# Patient Record
Sex: Female | Born: 1940 | Race: White | Hispanic: No | Smoking: Former smoker
Health system: Southern US, Community
[De-identification: ages and names within clinical notes are randomized; demographics above are authoritative.]

## PROBLEM LIST (undated history)

## (undated) HISTORY — PX: LUNG LOBECTOMY: SHX167

## (undated) HISTORY — PX: KNEE SURGERY: SHX244

---

## 2017-02-23 ENCOUNTER — Emergency Department (HOSPITAL_BASED_OUTPATIENT_CLINIC_OR_DEPARTMENT_OTHER)
Admission: EM | Admit: 2017-02-23 | Discharge: 2017-02-23 | Disposition: A | Discharge status: Home / Self Care | Payer: Medicare Other | Attending: Emergency Medicine | Admitting: Emergency Medicine

## 2017-02-23 ENCOUNTER — Emergency Department (HOSPITAL_BASED_OUTPATIENT_CLINIC_OR_DEPARTMENT_OTHER): Payer: Medicare Other

## 2017-02-23 ENCOUNTER — Encounter (HOSPITAL_BASED_OUTPATIENT_CLINIC_OR_DEPARTMENT_OTHER): Payer: Self-pay | Admitting: *Deleted

## 2017-02-23 ENCOUNTER — Other Ambulatory Visit: Payer: Self-pay

## 2017-02-23 DIAGNOSIS — M25562 Pain in left knee: Secondary | ICD-10-CM

## 2017-02-23 DIAGNOSIS — Z7982 Long term (current) use of aspirin: Secondary | ICD-10-CM | POA: Diagnosis not present

## 2017-02-23 DIAGNOSIS — Z87891 Personal history of nicotine dependence: Secondary | ICD-10-CM | POA: Insufficient documentation

## 2017-02-23 DIAGNOSIS — Z79899 Other long term (current) drug therapy: Secondary | ICD-10-CM | POA: Diagnosis not present

## 2017-02-23 DIAGNOSIS — M79605 Pain in left leg: Secondary | ICD-10-CM | POA: Diagnosis present

## 2017-02-23 DIAGNOSIS — Z794 Long term (current) use of insulin: Secondary | ICD-10-CM | POA: Diagnosis not present

## 2017-02-23 LAB — CBC WITH DIFFERENTIAL/PLATELET
BASOS ABS: 0 10*3/uL (ref 0.0–0.1)
Basophils Relative: 0 %
Eosinophils Absolute: 0.1 10*3/uL (ref 0.0–0.7)
Eosinophils Relative: 1 %
HCT: 30.1 % — ABNORMAL LOW (ref 36.0–46.0)
Hemoglobin: 9.2 g/dL — ABNORMAL LOW (ref 12.0–15.0)
LYMPHS PCT: 10 %
Lymphs Abs: 1.3 10*3/uL (ref 0.7–4.0)
MCH: 29.9 pg (ref 26.0–34.0)
MCHC: 30.6 g/dL (ref 30.0–36.0)
MCV: 97.7 fL (ref 78.0–100.0)
MONO ABS: 1 10*3/uL (ref 0.1–1.0)
Monocytes Relative: 8 %
Neutro Abs: 10.7 10*3/uL — ABNORMAL HIGH (ref 1.7–7.7)
Neutrophils Relative %: 81 %
PLATELETS: 193 10*3/uL (ref 150–400)
RBC: 3.08 MIL/uL — ABNORMAL LOW (ref 3.87–5.11)
RDW: 17.3 % — ABNORMAL HIGH (ref 11.5–15.5)
WBC: 13.1 10*3/uL — ABNORMAL HIGH (ref 4.0–10.5)

## 2017-02-23 MED ORDER — OXYCODONE-ACETAMINOPHEN 5-325 MG PO TABS
1.0000 | ORAL_TABLET | Freq: Once | ORAL | Status: AC
  Administered 2017-02-23: 1 via ORAL
  Filled 2017-02-23: qty 1

## 2017-02-23 MED ORDER — OXYCODONE-ACETAMINOPHEN 5-325 MG PO TABS: 1.0000 | ORAL_TABLET | Freq: Four times a day (QID) | ORAL | 0 refills | Status: AC | PRN

## 2017-02-23 NOTE — ED Provider Notes (Signed)
[facility] EMERGENCY DEPARTMENT Provider Note   CSN: [id number] Arrival date & time: 02/23/17  1421     History   Chief Complaint Chief Complaint  Patient presents with  . Leg Pain    HPI Karen Contreras is a 77 y.o. female.  HPI Patient presents with left calf and knee pain.  Began in the calf around 2 days ago now more in her left knee.  States she cannot walk because the pain.  No trauma.  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  No fevers or chills.  No cough.  Has chronic oxygen but states her breathing is not really any different than her baseline.  Has some chronic swelling of both her lower legs and states it is normally worse in the left side. No past medical history on file.  There are no active problems to display for this patient.   Past Surgical History:  Procedure Laterality Date  . KNEE SURGERY    . LUNG LOBECTOMY      OB History    No data available       Home Medications    Prior to Admission medications   Medication Sig Start Date End Date Taking? Authorizing Provider  acetaminophen (TYLENOL) 325 MG tablet Take 325 mg by mouth every 8 (eight) hours as needed.   Yes [provider]  albuterol (PROVENTIL) (2.5 MG/3ML) 0.083% nebulizer solution Take 2.5 mg by nebulization every 6 (six) hours as needed for wheezing or shortness of breath.   Yes [provider]  amLODipine (NORVASC) 5 MG tablet Take 2.5 mg by mouth daily.   Yes [provider]  aspirin 325 MG EC tablet Take 325 mg by mouth daily.   Yes [provider]  aspirin 81 MG chewable tablet Chew 81 mg by mouth daily.   Yes [provider]  calcium carbonate (OSCAL) 1500 (600 Ca) MG TABS tablet Take by mouth daily with breakfast.   Yes [provider]  cetirizine (ZYRTEC) 10 MG tablet Take 10 mg by mouth daily.   Yes [provider]  citalopram (CELEXA) 40 MG tablet Take 40 mg by mouth 2 (two) times daily.   Yes [provider]  ferrous sulfate 325 (65 FE) MG tablet Take 325 mg by mouth 2 (two) times daily with a meal.   Yes [provider]  glucosamine-chondroitin 500-400 MG tablet Take 1 tablet by mouth 3 (three) times daily.   Yes [provider]  insulin glargine (LANTUS) 100 UNIT/ML injection Inject 5 Units into the skin at bedtime.   Yes [provider]  insulin lispro (HUMALOG) 100 UNIT/ML injection Inject into the skin 3 (three) times daily before meals.   Yes [provider]  levothyroxine (SYNTHROID, LEVOTHROID) 50 MCG tablet Take 50 mcg by mouth daily before breakfast.   Yes [provider]  omeprazole (PRILOSEC) 20 MG capsule Take 20 mg by mouth daily.   Yes [provider]  ondansetron (ZOFRAN) 4 MG tablet Take 4 mg by mouth every 6 (six) hours as needed for nausea or vomiting.   Yes [provider]  OXYGEN Inhale 3 L/min into the lungs continuous.   Yes [provider]  pioglitazone (ACTOS) 45 MG tablet Take 45 mg by mouth daily.   Yes [provider]  polyethylene glycol (MIRALAX / GLYCOLAX) packet Take 17 g by mouth daily.   Yes [provider]  oxyCODONE-acetaminophen (PERCOCET/ROXICET) 5-325 MG tablet Take 1 tablet by mouth every  6 (six) hours as needed for severe pain. 02/23/17   Benjiman Core, MD    Family History No family history on file.  Social History Social History   Tobacco Use  . Smoking status: Former Games developer  . Smokeless tobacco: Never Used  Substance Use Topics  . Alcohol use: No    Frequency: Never  . Drug use: No     Allergies   Crestor [rosuvastatin] and Morphine and related   Review of Systems Review of Systems  Constitutional: Negative for chills and fever.  HENT: Negative for congestion.   Respiratory: Positive for shortness of breath.   Cardiovascular: Positive for leg swelling.  Gastrointestinal: Negative for abdominal pain.  Genitourinary: Negative for  flank pain.  Musculoskeletal:       Left calf left knee pain.  Neurological: Negative for seizures.  Hematological: Negative for adenopathy.  Psychiatric/Behavioral: Negative for confusion.     Physical Exam Updated Vital Signs BP (!) 105/93 (BP Location: Right Arm)   Pulse 96   Temp 98.7 F (37.1 C) (Oral)   Resp (!) 24   Ht 5\' 8"  (1.727 m)   Wt 135.6 kg (299 lb)   SpO2 97%   BMI 45.46 kg/m   Physical Exam  Constitutional: She appears well-developed.  Patient is obese  HENT:  Head: Normocephalic.  Cardiovascular: Normal rate.  Pulmonary/Chest: Effort normal.  Patient is on nasal cannula oxygen  Abdominal: There is no tenderness.  Musculoskeletal: She exhibits edema.  Some tenderness and decreased range of motion of left knee.  No erythema.  No effusion.  Tenderness over left calf.  There is edema bilateral lower extremities, worse on the left side.  Some delayed capillary refill also.  Strong dorsalis pedis pulse on the left side.  Neurological: She is alert.  Skin: Skin is warm. Capillary refill takes less than 2 seconds.  Psychiatric: She has a normal mood and affect.     ED Treatments / Results  Labs (all labs ordered are listed, but only abnormal results are displayed) Labs Reviewed  CBC WITH DIFFERENTIAL/PLATELET - Abnormal; Notable for the following components:      Result Value   WBC 13.1 (*)    RBC 3.08 (*)    Hemoglobin 9.2 (*)    HCT 30.1 (*)    RDW 17.3 (*)    Neutro Abs 10.7 (*)    All other components within normal limits    EKG  EKG Interpretation None       Radiology US Venous Img Lower Unilateral Left  Result Date: 02/23/2017 CLINICAL DATA:  77 year old female with left calf pain and swelling for the past 2 days EXAM: LEFT LOWER EXTREMITY VENOUS DOPPLER ULTRASOUND TECHNIQUE: Gray-scale sonography with graded compression, as well as color Doppler and duplex ultrasound were performed to evaluate the lower extremity deep venous systems from  the level of the common femoral vein and including the common femoral, femoral, profunda femoral, popliteal and calf veins including the posterior tibial, peroneal and gastrocnemius veins when visible. The superficial great saphenous vein was also interrogated. Spectral Doppler was utilized to evaluate flow at rest and with distal augmentation maneuvers in the common femoral, femoral and popliteal veins. COMPARISON:  None. FINDINGS: Contralateral Common Femoral Vein: Respiratory phasicity is normal and symmetric with the symptomatic side. No evidence of thrombus. Normal compressibility. Common Femoral Vein: No evidence of thrombus. Normal compressibility, respiratory phasicity and response to augmentation. Saphenofemoral Junction: No evidence of thrombus. Normal compressibility and flow on color Doppler imaging.  Profunda Femoral Vein: No evidence of thrombus. Normal compressibility and flow on color Doppler imaging. Femoral Vein: No evidence of thrombus. Normal compressibility, respiratory phasicity and response to augmentation. Popliteal Vein: No evidence of thrombus. Normal compressibility, respiratory phasicity and response to augmentation. Calf Veins: No evidence of thrombus. Normal compressibility and flow on color Doppler imaging. Superficial Great Saphenous Vein: No evidence of thrombus. Normal compressibility. Venous Reflux:  None. Other Findings:  None. IMPRESSION: No evidence of deep venous thrombosis. Electronically Signed   By: Malachy MoanHeath  McCullough M.D.   On: 02/23/2017 16:57   Dg Knee Complete 4 Views Left  Result Date: 02/23/2017 CLINICAL DATA:  Left knee pain beginning last night with weight-bearing and movement. EXAM: LEFT KNEE - COMPLETE 4+ VIEW COMPARISON:  None. FINDINGS: Previous total knee replacement. Components grossly well positioned. Small amount of knee joint fluid. No acute or unexpected finding. IMPRESSION: No acute or unexpected finding. Previous total knee replacement. Small knee joint  effusion. Electronically Signed   By: Paulina FusiMark  Shogry M.D.   On: 02/23/2017 15:43    Procedures Procedures (including critical care time)  Medications Ordered in ED Medications  oxyCODONE-acetaminophen (PERCOCET/ROXICET) 5-325 MG per tablet 1 tablet (1 tablet Oral Given 02/23/17 1606)     Initial Impression / Assessment and Plan / ED Course  I have reviewed the triage vital signs and the nursing notes.  Pertinent labs & imaging results that were available during my care of the patient were reviewed by me and considered in my medical decision making (see chart for details).     Patient with left knee pain.  Negative Doppler.  X-ray reassuring.  No erythema of the knee.  No fevers.  White count is mildly elevated.  Feels better after Percocet.  Has follow-up appointment with her orthopedic surgeon.  Will discharge home.  Final Clinical Impressions(s) / ED Diagnoses   Final diagnoses:  Acute pain of left knee    ED Discharge Orders        Ordered    oxyCODONE-acetaminophen (PERCOCET/ROXICET) 5-325 MG tablet  Every 6 hours PRN     02/23/17 1806       Benjiman CorePickering, Kwesi Sangha, MD 02/23/17 1830

## 2017-02-23 NOTE — ED Triage Notes (Signed)
Left calf pain x 2 days. Pain has gotten worse and is now at knee level.

## 2017-02-23 NOTE — Discharge Instructions (Signed)
Follow-up with your orthopedic surgeon as planned.  You can take the oxycodone acetaminophen 5/325 1 pill every 6 hours as needed. You may administer it yourself.

## 2018-04-18 ENCOUNTER — Inpatient Hospital Stay
Admission: AD | Admit: 2018-04-18 | Discharge: 2018-05-07 | Disposition: A | Discharge status: Home Health | Payer: Self-pay | Source: Other Acute Inpatient Hospital | Attending: Internal Medicine | Admitting: Internal Medicine

## 2018-04-18 ENCOUNTER — Other Ambulatory Visit (HOSPITAL_COMMUNITY): Payer: Self-pay

## 2018-04-18 DIAGNOSIS — I509 Heart failure, unspecified: Secondary | ICD-10-CM

## 2018-04-18 MED ORDER — GLUCAGON HCL RDNA (DIAGNOSTIC) 1 MG IJ SOLR
1.00 | INTRAMUSCULAR | Status: DC
Start: ? — End: 2018-04-18

## 2018-04-18 MED ORDER — PANTOPRAZOLE SODIUM 40 MG PO TBEC
40.00 | DELAYED_RELEASE_TABLET | ORAL | Status: DC
Start: 2018-04-19 — End: 2018-04-18

## 2018-04-18 MED ORDER — FOLIC ACID 1 MG PO TABS
1.00 | ORAL_TABLET | ORAL | Status: DC
Start: 2018-04-19 — End: 2018-04-18

## 2018-04-18 MED ORDER — ASPIRIN EC 81 MG PO TBEC
81.00 | DELAYED_RELEASE_TABLET | ORAL | Status: DC
Start: 2018-04-19 — End: 2018-04-18

## 2018-04-18 MED ORDER — GENERIC EXTERNAL MEDICATION
80.00 | Status: DC
Start: 2018-04-18 — End: 2018-04-18

## 2018-04-18 MED ORDER — MAGNESIUM OXIDE 400 MG PO TABS
400.00 | ORAL_TABLET | ORAL | Status: DC
Start: 2018-04-18 — End: 2018-04-18

## 2018-04-18 MED ORDER — GLUCOSE 40 % PO GEL
15.00 | ORAL | Status: DC
Start: ? — End: 2018-04-18

## 2018-04-18 MED ORDER — DEXTROSE 10 % IV SOLN
125.00 | INTRAVENOUS | Status: DC
Start: ? — End: 2018-04-18

## 2018-04-18 MED ORDER — FERROUS SULFATE 325 (65 FE) MG PO TABS
325.00 | ORAL_TABLET | ORAL | Status: DC
Start: 2018-04-18 — End: 2018-04-18

## 2018-04-18 MED ORDER — CETIRIZINE HCL 10 MG PO TABS
10.00 | ORAL_TABLET | ORAL | Status: DC
Start: 2018-04-19 — End: 2018-04-18

## 2018-04-18 MED ORDER — EZETIMIBE 10 MG PO TABS
10.00 | ORAL_TABLET | ORAL | Status: DC
Start: 2018-04-19 — End: 2018-04-18

## 2018-04-18 MED ORDER — ALPRAZOLAM 0.5 MG PO TABS
.50 | ORAL_TABLET | ORAL | Status: DC
Start: ? — End: 2018-04-18

## 2018-04-18 MED ORDER — INSULIN LISPRO 100 UNIT/ML ~~LOC~~ SOLN
1.00 | SUBCUTANEOUS | Status: DC
Start: 2018-04-18 — End: 2018-04-18

## 2018-04-18 MED ORDER — LEVOTHYROXINE SODIUM 50 MCG PO TABS
50.00 | ORAL_TABLET | ORAL | Status: DC
Start: 2018-04-19 — End: 2018-04-18

## 2018-04-18 MED ORDER — ACETAMINOPHEN 325 MG PO TABS
650.00 | ORAL_TABLET | ORAL | Status: DC
Start: ? — End: 2018-04-18

## 2018-04-18 MED ORDER — FLUTICASONE FUROATE-VILANTEROL 100-25 MCG/INH IN AEPB
1.00 | INHALATION_SPRAY | RESPIRATORY_TRACT | Status: DC
Start: 2018-04-19 — End: 2018-04-18

## 2018-04-18 MED ORDER — INSULIN GLARGINE 100 UNIT/ML ~~LOC~~ SOLN
10.00 | SUBCUTANEOUS | Status: DC
Start: 2018-04-18 — End: 2018-04-18

## 2018-04-18 MED ORDER — PREDNISONE 20 MG PO TABS
40.00 | ORAL_TABLET | ORAL | Status: DC
Start: 2018-04-19 — End: 2018-04-18

## 2018-04-18 MED ORDER — POTASSIUM CHLORIDE CRYS ER 20 MEQ PO TBCR
20.00 | EXTENDED_RELEASE_TABLET | ORAL | Status: DC
Start: 2018-04-19 — End: 2018-04-18

## 2018-04-18 MED ORDER — DOCUSATE SODIUM 100 MG PO CAPS
100.00 | ORAL_CAPSULE | ORAL | Status: DC
Start: ? — End: 2018-04-18

## 2018-04-18 MED ORDER — SODIUM CHLORIDE FLUSH 0.9 % IV SOLN
5.00 | INTRAVENOUS | Status: DC
Start: 2018-04-18 — End: 2018-04-18

## 2018-04-18 MED ORDER — POLYETHYLENE GLYCOL 3350 17 G PO PACK
17.00 | PACK | ORAL | Status: DC
Start: ? — End: 2018-04-18

## 2018-04-18 MED ORDER — ALBUTEROL SULFATE (2.5 MG/3ML) 0.083% IN NEBU
2.50 | INHALATION_SOLUTION | RESPIRATORY_TRACT | Status: DC
Start: ? — End: 2018-04-18

## 2018-04-18 MED ORDER — AMLODIPINE BESYLATE 2.5 MG PO TABS
2.50 | ORAL_TABLET | ORAL | Status: DC
Start: 2018-04-19 — End: 2018-04-18

## 2018-04-18 MED ORDER — ONDANSETRON HCL 4 MG/2ML IJ SOLN
4.00 | INTRAMUSCULAR | Status: DC
Start: ? — End: 2018-04-18

## 2018-04-18 MED ORDER — HEPARIN SODIUM (PORCINE) 5000 UNIT/ML IJ SOLN
5000.00 | INTRAMUSCULAR | Status: DC
Start: 2018-04-18 — End: 2018-04-18

## 2018-04-18 MED ORDER — IPRATROPIUM BROMIDE 0.02 % IN SOLN
500.00 | RESPIRATORY_TRACT | Status: DC
Start: ? — End: 2018-04-18

## 2018-04-18 MED ORDER — CITALOPRAM HYDROBROMIDE 20 MG PO TABS
40.00 | ORAL_TABLET | ORAL | Status: DC
Start: 2018-04-19 — End: 2018-04-18

## 2018-04-19 LAB — CBC WITH DIFFERENTIAL/PLATELET
Abs Immature Granulocytes: 0.03 10*3/uL (ref 0.00–0.07)
Basophils Absolute: 0 10*3/uL (ref 0.0–0.1)
Basophils Relative: 0 %
Eosinophils Absolute: 0.2 10*3/uL (ref 0.0–0.5)
Eosinophils Relative: 3 %
HCT: 27.5 % — ABNORMAL LOW (ref 36.0–46.0)
Hemoglobin: 8 g/dL — ABNORMAL LOW (ref 12.0–15.0)
Immature Granulocytes: 0 %
Lymphocytes Relative: 13 %
Lymphs Abs: 0.9 10*3/uL (ref 0.7–4.0)
MCH: 30.8 pg (ref 26.0–34.0)
MCHC: 29.1 g/dL — ABNORMAL LOW (ref 30.0–36.0)
MCV: 105.8 fL — ABNORMAL HIGH (ref 80.0–100.0)
Monocytes Absolute: 0.7 10*3/uL (ref 0.1–1.0)
Monocytes Relative: 10 %
Neutro Abs: 5.3 10*3/uL (ref 1.7–7.7)
Neutrophils Relative %: 74 %
Platelets: 171 10*3/uL (ref 150–400)
RBC: 2.6 MIL/uL — ABNORMAL LOW (ref 3.87–5.11)
RDW: 16.1 % — ABNORMAL HIGH (ref 11.5–15.5)
WBC: 7.1 10*3/uL (ref 4.0–10.5)
nRBC: 0 % (ref 0.0–0.2)

## 2018-04-19 LAB — COMPREHENSIVE METABOLIC PANEL
ALT: 14 U/L (ref 0–44)
AST: 19 U/L (ref 15–41)
Albumin: 2.7 g/dL — ABNORMAL LOW (ref 3.5–5.0)
Alkaline Phosphatase: 42 U/L (ref 38–126)
BUN: 38 mg/dL — ABNORMAL HIGH (ref 8–23)
CO2: <50 mmol/L — ABNORMAL HIGH (ref 22–32)
Calcium: 9.5 mg/dL (ref 8.9–10.3)
Chloride: 76 mmol/L — ABNORMAL LOW (ref 98–111)
Creatinine, Ser: 0.86 mg/dL (ref 0.44–1.00)
GFR calc Af Amer: >60 mL/min (ref >60)
GFR calc non Af Amer: >60 mL/min (ref >60)
Glucose, Bld: 100 mg/dL — ABNORMAL HIGH (ref 70–99)
Potassium: 3.1 mmol/L — ABNORMAL LOW (ref 3.5–5.1)
Sodium: 139 mmol/L (ref 135–145)
Total Bilirubin: 0.6 mg/dL (ref 0.3–1.2)
Total Protein: 6.5 g/dL (ref 6.5–8.1)

## 2018-04-19 LAB — HEMOGLOBIN A1C
Hgb A1c MFr Bld: 5.6 % (ref 4.8–5.6)
Mean Plasma Glucose: 114.02 mg/dL

## 2018-04-19 LAB — BRAIN NATRIURETIC PEPTIDE: B Natriuretic Peptide: 433.1 pg/mL — ABNORMAL HIGH (ref 0.0–100.0)

## 2018-04-20 LAB — POTASSIUM: Potassium: 3.8 mmol/L (ref 3.5–5.1)

## 2018-04-25 LAB — CBC
HCT: 30.8 % — ABNORMAL LOW (ref 36.0–46.0)
Hemoglobin: 9.1 g/dL — ABNORMAL LOW (ref 12.0–15.0)
MCH: 31.2 pg (ref 26.0–34.0)
MCHC: 29.5 g/dL — ABNORMAL LOW (ref 30.0–36.0)
MCV: 105.5 fL — ABNORMAL HIGH (ref 80.0–100.0)
Platelets: 172 10*3/uL (ref 150–400)
RBC: 2.92 MIL/uL — ABNORMAL LOW (ref 3.87–5.11)
RDW: 16.1 % — ABNORMAL HIGH (ref 11.5–15.5)
WBC: 8.7 10*3/uL (ref 4.0–10.5)
nRBC: 0 % (ref 0.0–0.2)

## 2018-04-25 LAB — BASIC METABOLIC PANEL
BUN: 45 mg/dL — ABNORMAL HIGH (ref 8–23)
CO2: >50 mmol/L — ABNORMAL HIGH (ref 22–32)
Calcium: 9.9 mg/dL (ref 8.9–10.3)
Chloride: 71 mmol/L — ABNORMAL LOW (ref 98–111)
Creatinine, Ser: 0.84 mg/dL (ref 0.44–1.00)
GFR calc Af Amer: >60 mL/min (ref >60)
GFR calc non Af Amer: >60 mL/min (ref >60)
Glucose, Bld: 123 mg/dL — ABNORMAL HIGH (ref 70–99)
Potassium: 3.7 mmol/L (ref 3.5–5.1)
Sodium: 136 mmol/L (ref 135–145)

## 2018-04-29 LAB — BASIC METABOLIC PANEL
BUN: 28 mg/dL — ABNORMAL HIGH (ref 8–23)
CO2: >50 mmol/L — ABNORMAL HIGH (ref 22–32)
Calcium: 9.7 mg/dL (ref 8.9–10.3)
Chloride: 78 mmol/L — ABNORMAL LOW (ref 98–111)
Creatinine, Ser: 0.84 mg/dL (ref 0.44–1.00)
GFR calc Af Amer: >60 mL/min (ref >60)
GFR calc non Af Amer: >60 mL/min (ref >60)
Glucose, Bld: 192 mg/dL — ABNORMAL HIGH (ref 70–99)
Potassium: 3.7 mmol/L (ref 3.5–5.1)
Sodium: 138 mmol/L (ref 135–145)

## 2018-04-30 LAB — BASIC METABOLIC PANEL
Anion gap: 11 (ref 5–15)
BUN: 26 mg/dL — ABNORMAL HIGH (ref 8–23)
CO2: 49 mmol/L — ABNORMAL HIGH (ref 22–32)
Calcium: 9.8 mg/dL (ref 8.9–10.3)
Chloride: 79 mmol/L — ABNORMAL LOW (ref 98–111)
Creatinine, Ser: 0.8 mg/dL (ref 0.44–1.00)
GFR calc Af Amer: >60 mL/min (ref >60)
GFR calc non Af Amer: >60 mL/min (ref >60)
Glucose, Bld: 121 mg/dL — ABNORMAL HIGH (ref 70–99)
Potassium: 3.6 mmol/L (ref 3.5–5.1)
Sodium: 139 mmol/L (ref 135–145)

## 2018-07-20 DIAGNOSIS — J9621 Acute and chronic respiratory failure with hypoxia: Secondary | ICD-10-CM

## 2018-07-20 DIAGNOSIS — J449 Chronic obstructive pulmonary disease, unspecified: Secondary | ICD-10-CM

## 2018-07-20 DIAGNOSIS — G4733 Obstructive sleep apnea (adult) (pediatric): Secondary | ICD-10-CM

## 2018-07-20 DIAGNOSIS — J189 Pneumonia, unspecified organism: Secondary | ICD-10-CM

## 2018-07-20 DIAGNOSIS — I5032 Chronic diastolic (congestive) heart failure: Secondary | ICD-10-CM

## 2018-07-21 DIAGNOSIS — J449 Chronic obstructive pulmonary disease, unspecified: Secondary | ICD-10-CM

## 2018-07-21 DIAGNOSIS — J189 Pneumonia, unspecified organism: Secondary | ICD-10-CM | POA: Diagnosis not present

## 2018-07-21 DIAGNOSIS — I5032 Chronic diastolic (congestive) heart failure: Secondary | ICD-10-CM

## 2018-07-21 DIAGNOSIS — G4733 Obstructive sleep apnea (adult) (pediatric): Secondary | ICD-10-CM

## 2018-07-21 DIAGNOSIS — J9621 Acute and chronic respiratory failure with hypoxia: Secondary | ICD-10-CM

## 2018-07-22 DIAGNOSIS — J189 Pneumonia, unspecified organism: Secondary | ICD-10-CM | POA: Diagnosis not present

## 2018-07-22 DIAGNOSIS — J449 Chronic obstructive pulmonary disease, unspecified: Secondary | ICD-10-CM

## 2018-07-22 DIAGNOSIS — G4733 Obstructive sleep apnea (adult) (pediatric): Secondary | ICD-10-CM

## 2018-07-22 DIAGNOSIS — J9621 Acute and chronic respiratory failure with hypoxia: Secondary | ICD-10-CM | POA: Diagnosis not present

## 2018-07-22 DIAGNOSIS — I5032 Chronic diastolic (congestive) heart failure: Secondary | ICD-10-CM | POA: Diagnosis not present

## 2018-07-23 DIAGNOSIS — I5032 Chronic diastolic (congestive) heart failure: Secondary | ICD-10-CM

## 2018-07-23 DIAGNOSIS — G4733 Obstructive sleep apnea (adult) (pediatric): Secondary | ICD-10-CM

## 2018-07-23 DIAGNOSIS — J9621 Acute and chronic respiratory failure with hypoxia: Secondary | ICD-10-CM

## 2018-07-23 DIAGNOSIS — J189 Pneumonia, unspecified organism: Secondary | ICD-10-CM

## 2018-07-23 DIAGNOSIS — J449 Chronic obstructive pulmonary disease, unspecified: Secondary | ICD-10-CM | POA: Diagnosis not present

## 2018-07-24 DIAGNOSIS — J189 Pneumonia, unspecified organism: Secondary | ICD-10-CM

## 2018-07-24 DIAGNOSIS — J449 Chronic obstructive pulmonary disease, unspecified: Secondary | ICD-10-CM

## 2018-07-24 DIAGNOSIS — I5032 Chronic diastolic (congestive) heart failure: Secondary | ICD-10-CM

## 2018-07-24 DIAGNOSIS — J9621 Acute and chronic respiratory failure with hypoxia: Secondary | ICD-10-CM | POA: Diagnosis not present

## 2018-07-24 DIAGNOSIS — G4733 Obstructive sleep apnea (adult) (pediatric): Secondary | ICD-10-CM

## 2018-07-25 DIAGNOSIS — G4733 Obstructive sleep apnea (adult) (pediatric): Secondary | ICD-10-CM

## 2018-07-25 DIAGNOSIS — J189 Pneumonia, unspecified organism: Secondary | ICD-10-CM

## 2018-07-25 DIAGNOSIS — J449 Chronic obstructive pulmonary disease, unspecified: Secondary | ICD-10-CM

## 2018-07-25 DIAGNOSIS — J9621 Acute and chronic respiratory failure with hypoxia: Secondary | ICD-10-CM

## 2018-07-25 DIAGNOSIS — I5032 Chronic diastolic (congestive) heart failure: Secondary | ICD-10-CM

## 2018-07-26 DIAGNOSIS — J189 Pneumonia, unspecified organism: Secondary | ICD-10-CM | POA: Diagnosis not present

## 2018-07-26 DIAGNOSIS — J449 Chronic obstructive pulmonary disease, unspecified: Secondary | ICD-10-CM

## 2018-07-26 DIAGNOSIS — J9621 Acute and chronic respiratory failure with hypoxia: Secondary | ICD-10-CM | POA: Diagnosis not present

## 2018-07-26 DIAGNOSIS — I5032 Chronic diastolic (congestive) heart failure: Secondary | ICD-10-CM | POA: Diagnosis not present

## 2018-07-26 DIAGNOSIS — G4733 Obstructive sleep apnea (adult) (pediatric): Secondary | ICD-10-CM

## 2018-07-27 DIAGNOSIS — G4733 Obstructive sleep apnea (adult) (pediatric): Secondary | ICD-10-CM

## 2018-07-27 DIAGNOSIS — J449 Chronic obstructive pulmonary disease, unspecified: Secondary | ICD-10-CM

## 2018-07-27 DIAGNOSIS — I5032 Chronic diastolic (congestive) heart failure: Secondary | ICD-10-CM | POA: Diagnosis not present

## 2018-07-27 DIAGNOSIS — J9621 Acute and chronic respiratory failure with hypoxia: Secondary | ICD-10-CM

## 2018-07-27 DIAGNOSIS — J189 Pneumonia, unspecified organism: Secondary | ICD-10-CM | POA: Diagnosis not present

## 2018-07-28 DIAGNOSIS — J9621 Acute and chronic respiratory failure with hypoxia: Secondary | ICD-10-CM | POA: Diagnosis not present

## 2018-07-28 DIAGNOSIS — J449 Chronic obstructive pulmonary disease, unspecified: Secondary | ICD-10-CM | POA: Diagnosis not present

## 2018-07-28 DIAGNOSIS — J189 Pneumonia, unspecified organism: Secondary | ICD-10-CM

## 2018-07-28 DIAGNOSIS — I5032 Chronic diastolic (congestive) heart failure: Secondary | ICD-10-CM | POA: Diagnosis not present

## 2018-07-28 DIAGNOSIS — G4733 Obstructive sleep apnea (adult) (pediatric): Secondary | ICD-10-CM

## 2018-07-29 DIAGNOSIS — J189 Pneumonia, unspecified organism: Secondary | ICD-10-CM | POA: Diagnosis not present

## 2018-07-29 DIAGNOSIS — J449 Chronic obstructive pulmonary disease, unspecified: Secondary | ICD-10-CM | POA: Diagnosis not present

## 2018-07-29 DIAGNOSIS — G4733 Obstructive sleep apnea (adult) (pediatric): Secondary | ICD-10-CM

## 2018-07-29 DIAGNOSIS — I5032 Chronic diastolic (congestive) heart failure: Secondary | ICD-10-CM | POA: Diagnosis not present

## 2018-07-29 DIAGNOSIS — J9621 Acute and chronic respiratory failure with hypoxia: Secondary | ICD-10-CM

## 2018-07-30 DIAGNOSIS — I5032 Chronic diastolic (congestive) heart failure: Secondary | ICD-10-CM

## 2018-07-30 DIAGNOSIS — J9621 Acute and chronic respiratory failure with hypoxia: Secondary | ICD-10-CM

## 2018-07-30 DIAGNOSIS — J449 Chronic obstructive pulmonary disease, unspecified: Secondary | ICD-10-CM

## 2018-07-30 DIAGNOSIS — G4733 Obstructive sleep apnea (adult) (pediatric): Secondary | ICD-10-CM

## 2018-07-30 DIAGNOSIS — J189 Pneumonia, unspecified organism: Secondary | ICD-10-CM | POA: Diagnosis not present

## 2018-08-18 DEATH — deceased

## 2019-09-14 IMAGING — DX PORTABLE CHEST - 1 VIEW
1 series · 1 of 1 positions shown · non-contrast
Comparison: None.

CLINICAL DATA: SOB.

EXAM:
PORTABLE CHEST 1 VIEW

[chest]
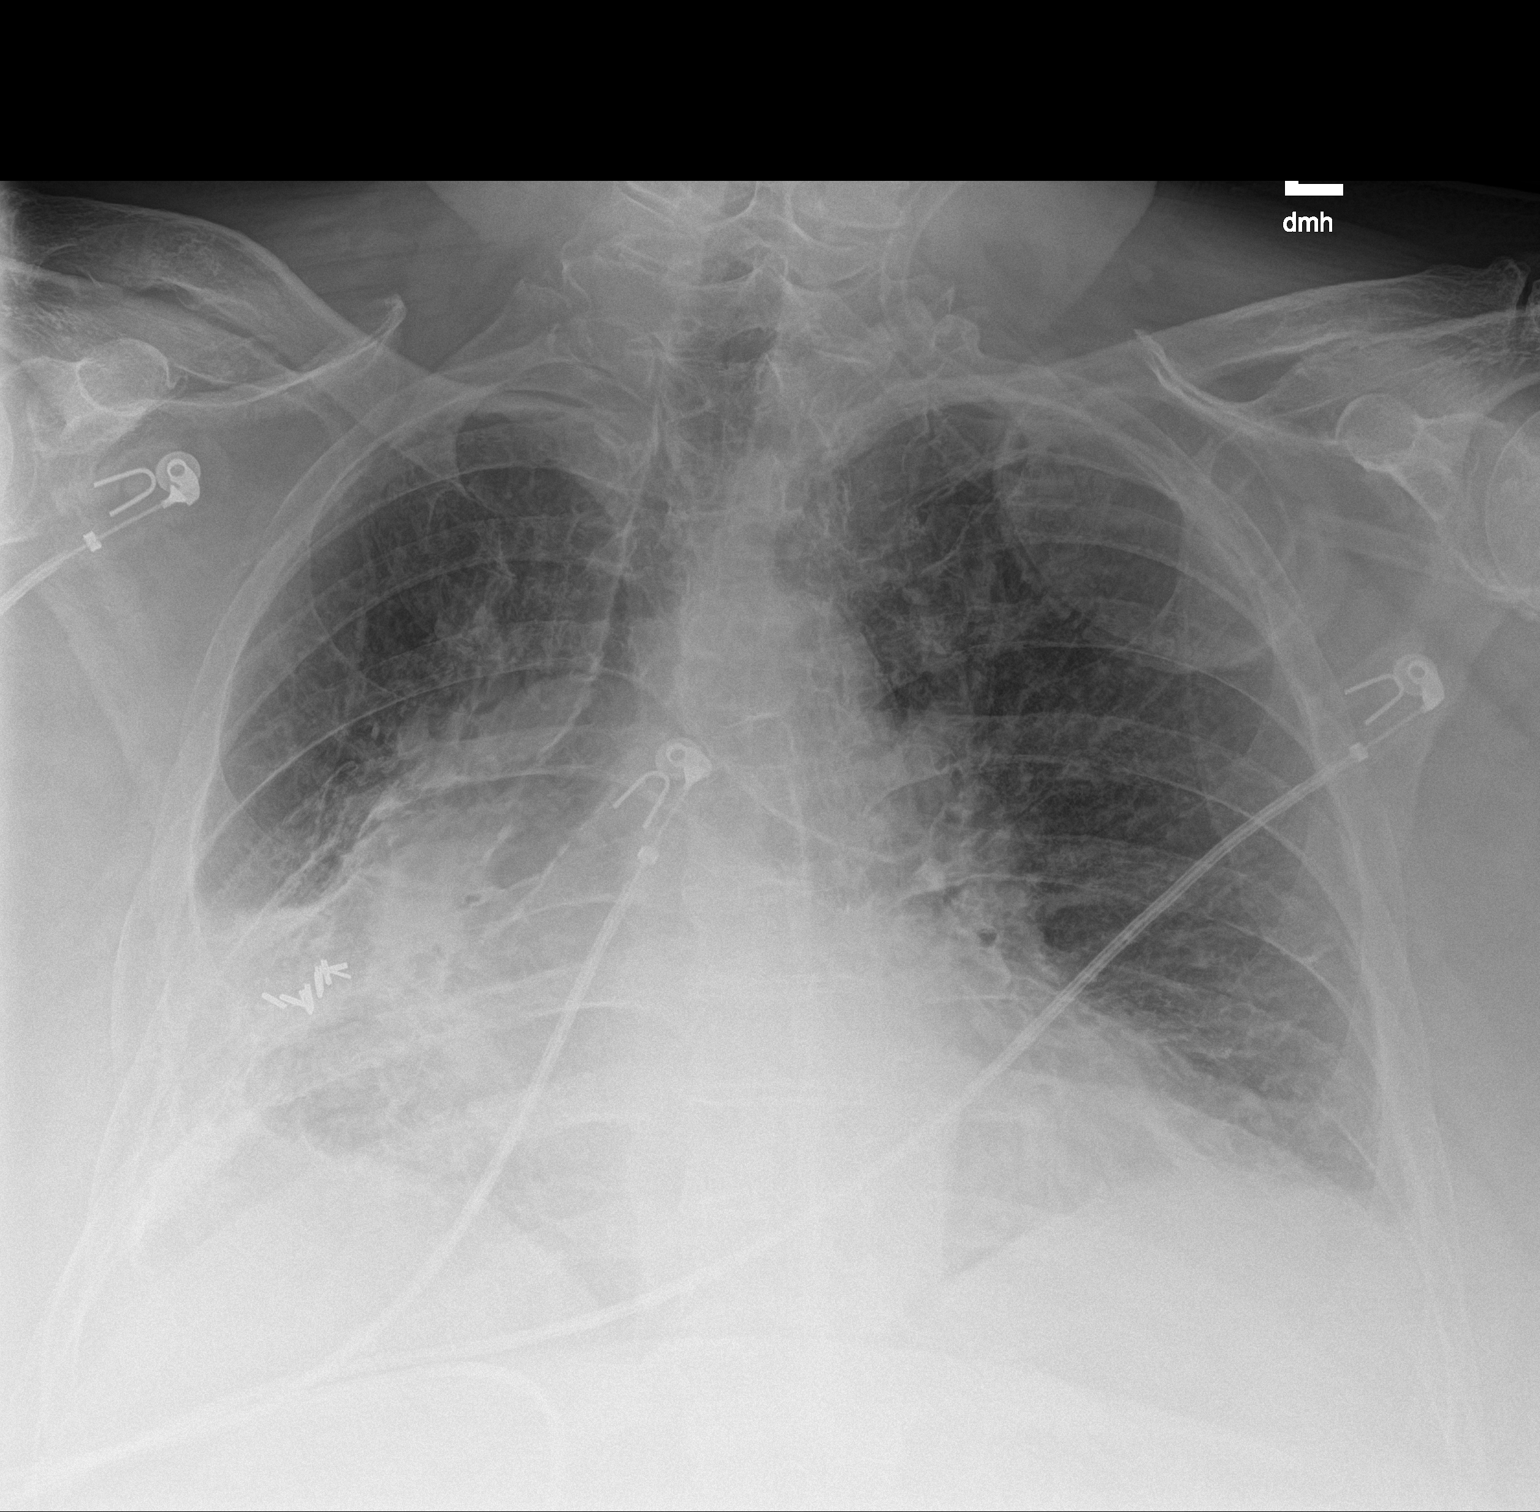

[1 of 1 positions shown; findings below may reference images not displayed]

FINDINGS: The heart is enlarged. Surgical clips in the RIGHT lung base,
associated with pleuroparenchymal scarring. Uncertain if
superimposed acute changes are present. There are low lung volumes.
Slight blunting LEFT CP angle. Mild vascular congestion, possible
early pulmonary edema. Osteopenia.
IMPRESSION: Cardiomegaly. Postsurgical change in the RIGHT lower hemithorax.
Superimposed mild edema is not excluded.
